# Patient Record
Sex: Female | Born: 1995 | Race: Black or African American | Hispanic: No | Marital: Single | Smoking: Never smoker
Health system: Southern US, Community
[De-identification: ages and names within clinical notes are randomized; demographics above are authoritative.]

---

## 2016-09-21 ENCOUNTER — Encounter (HOSPITAL_COMMUNITY): Payer: Self-pay

## 2016-09-21 ENCOUNTER — Emergency Department (HOSPITAL_COMMUNITY)
Admission: EM | Admit: 2016-09-21 | Discharge: 2016-09-22 | Disposition: A | Discharge status: Home / Self Care | Payer: Self-pay | Attending: Emergency Medicine | Admitting: Emergency Medicine

## 2016-09-21 ENCOUNTER — Emergency Department (HOSPITAL_COMMUNITY): Payer: Self-pay

## 2016-09-21 DIAGNOSIS — J069 Acute upper respiratory infection, unspecified: Secondary | ICD-10-CM | POA: Insufficient documentation

## 2016-09-21 DIAGNOSIS — R05 Cough: Secondary | ICD-10-CM | POA: Insufficient documentation

## 2016-09-21 NOTE — ED Provider Notes (Signed)
[facility]-EMERGENCY DEPT Provider Note   CSN: [id number] Arrival date & time: 09/21/16  2026  By signing my name below, I, Shannon Sloan, attest that this documentation has been prepared under the direction and in the presence of [employer] Eathon Valade, PA-C.  Electronically Signed: Rosario AdieWilliam Andrew Sloan, ED Scribe. 09/22/16. 12:06 AM.  History   Chief Complaint Chief Complaint  Patient presents with  . Cough   The history is provided by the patient. No language interpreter was used.    HPI Comments: Shannon Sloan is a 21 y.o. female with no pertinent PMHx, who presents to the Emergency Department complaining of persistent, gradually worsening, productive cough with yellow-green sputum beginning four days ago. She additionally notes that over the past two days that some of her sputum contained blood-tinged specks and that this problem has also been gradually worsening. She reports associated sore throat with the onset of her cough. Pt additionally notes that she has chest pain secondary to coughing only, but none otherwise. No noted treatments for her symptoms were tried prior to coming into the ED. No Hx of PE/DVT, recent long travel, surgery, fracture, prolonged immobilization, hormone use.  She denies shortness of breath, fever, claudication, leg swelling, sensation of palpitations, or any other associated symptoms.   History reviewed. No pertinent past medical history.  There are no active problems to display for this patient.   History reviewed. No pertinent surgical history.  OB History    No data available       Home Medications    Prior to Admission medications   Medication Sig Start Date End Date Taking? Authorizing Provider  azithromycin (ZITHROMAX Z-PAK) 250 MG tablet Take 2 tablets the first day and then 1 tablet daily for 4 more days 09/22/16   Shannon ReiningNicole Jessel Gettinger, PA-C  benzonatate (TESSALON) 100 MG capsule Take 1 capsule (100 mg total) by mouth every 8 (eight) hours.  09/22/16   Shannon ReiningNicole Sham Alviar, PA-C    Family History No family history on file.  Social History Social History  Substance Use Topics  . Smoking status: Never Smoker  . Smokeless tobacco: Never Used  . Alcohol use No     Allergies   Patient has no allergy information on record.   Review of Systems Review of Systems  A complete 10 system review of systems was obtained and all systems are negative except as noted in the HPI and PMH.   Physical Exam Updated Vital Signs BP 127/62 (BP Location: Right Arm)   Pulse 88   Temp 99.5 F (37.5 C) (Oral)   Resp 16   LMP 09/02/2016   SpO2 100%   Physical Exam  Constitutional: She is oriented to person, place, and time. She appears well-developed and well-nourished. No distress.  HENT:  Head: Normocephalic.  Right Ear: External ear normal.  Left Ear: External ear normal.  Mouth/Throat: Oropharynx is clear and moist. No oropharyngeal exudate.  No drooling or stridor. Posterior pharynx mildly erythematous no significant tonsillar hypertrophy. No exudate. Soft palate rises symmetrically. No TTP or induration under tongue.   No tenderness to palpation of frontal or bilateral maxillary sinuses.  Mild mucosal edema in the nares with scant rhinorrhea.  Bilateral tympanic membranes with normal architecture and good light reflex.    Eyes: Conjunctivae and EOM are normal. Pupils are equal, round, and reactive to light.  Neck: Normal range of motion. Neck supple. No JVD present. No tracheal deviation present.  Cardiovascular: Normal rate, regular rhythm and intact distal pulses.  Pulmonary/Chest: Effort normal and breath sounds normal. No stridor. No respiratory distress. She has no wheezes. She has no rales. She exhibits no tenderness.  Abdominal: Soft. She exhibits no distension and no mass. There is no tenderness. There is no rebound and no guarding.  Musculoskeletal: Normal range of motion. She exhibits no edema or tenderness.  No  calf asymmetry, superficial collaterals, palpable cords, edema, Homans sign negative bilaterally.    Neurological: She is alert and oriented to person, place, and time.  Skin: Skin is warm. She is not diaphoretic.  Psychiatric: She has a normal mood and affect.  Nursing note and vitals reviewed.    ED Treatments / Results  DIAGNOSTIC STUDIES: Oxygen Saturation is 100% on RA, normal by my interpretation.   COORDINATION OF CARE: 12:06 AM-Discussed next steps with pt. Pt verbalized understanding and is agreeable with the plan.   Labs (all labs ordered are listed, but only abnormal results are displayed) Labs Reviewed - No data to display  EKG  EKG Interpretation None       Radiology Dg Chest 2 View  Result Date: 09/21/2016 CLINICAL DATA:  Acute onset of productive cough. Blood-tinged mucus. Initial encounter. EXAM: CHEST  2 VIEW COMPARISON:  None. FINDINGS: The lungs are well-aerated and clear. There is no evidence of focal opacification, pleural effusion or pneumothorax. The heart is normal in size; the mediastinal contour is within normal limits. No acute osseous abnormalities are seen. IMPRESSION: No acute cardiopulmonary process seen. Electronically Signed   By: Roanna Raider M.D.   On: 09/21/2016 21:58    Procedures Procedures (including critical care time)  Medications Ordered in ED Medications - No data to display   Initial Impression / Assessment and Plan / ED Course  I have reviewed the triage vital signs and the nursing notes.  Pertinent labs & imaging results that were available during my care of the patient were reviewed by me and considered in my medical decision making (see chart for details).  Clinical Course     Vitals:   09/21/16 2110 09/21/16 2346  BP: 127/62   Pulse: 88   Resp: 16   Temp: 98.9 F (37.2 C) 99.5 F (37.5 C)  TempSrc: Oral Oral  SpO2: 100%      Shannon Sloan is 21 y.o. female presenting with Rhinorrhea, cough worsening over  the course of the week. She had some blood-streaked sputum. I doubt this is PE, patient has no risk factors, there is no tachypnea or tachycardia. This was likely a URI. Given the worsening symptoms will cover for bacterial infection with azithromycin, patient given Jerilynn Som and work note  Evaluation does not show pathology that would require ongoing emergent intervention or inpatient treatment. Pt is hemodynamically stable and mentating appropriately. Discussed findings and plan with patient/guardian, who agrees with care plan. All questions answered. Return precautions discussed and outpatient follow up given.      Final Clinical Impressions(s) / ED Diagnoses   Final diagnoses:  Upper respiratory tract infection, unspecified type    New Prescriptions New Prescriptions   AZITHROMYCIN (ZITHROMAX Z-PAK) 250 MG TABLET    Take 2 tablets the first day and then 1 tablet daily for 4 more days   BENZONATATE (TESSALON) 100 MG CAPSULE    Take 1 capsule (100 mg total) by mouth every 8 (eight) hours.   I personally performed the services described in this documentation, which was scribed in my presence. The recorded information has been reviewed and is accurate.  Wynetta Emery, PA-C 09/22/16 0017    Layla Maw Ward, DO 09/22/16 [id number]

## 2016-09-21 NOTE — ED Triage Notes (Signed)
Pt reports productive cough since Monday, today she noticed her mucus was blood tinged. Denies any pain but reports dry throat.

## 2016-09-21 NOTE — ED Notes (Signed)
Pt stated she has been running a fever, chills then hot, achy, congestion, headache and coughing.

## 2016-09-22 MED ORDER — AZITHROMYCIN 250 MG PO TABS: ORAL_TABLET | ORAL | 0 refills | Status: AC

## 2016-09-22 MED ORDER — BENZONATATE 100 MG PO CAPS: 100.0000 mg | ORAL_CAPSULE | Freq: Three times a day (TID) | ORAL | 0 refills | Status: AC

## 2016-09-22 NOTE — Discharge Instructions (Signed)
Return to the emergency room for any worsening or concerning symptoms including fast breathing, heart racing, confusion, vomiting.  Rest, cover your mouth when you cough and wash your hands frequently.   Push fluids: water or Gatorade, do not drink any soda, juice or caffeinated beverages.  For fever and pain control you can take Motrin (ibuprofen) as follows: 400 mg (this is normally 2 over the counter pills) every 4 hours with food.  Do not return to work until a day after your fever breaks.  Take your antibiotics as directed and to completion. You should never have any leftover antibiotics! Push fluids and stay well hydrated.   Any antibiotic use can reduce the efficacy of hormonal birth control. Please use back up method of contraception.

## 2018-01-12 IMAGING — DX DG CHEST 2V
2 series · 2 of 2 positions shown · non-contrast
Comparison: None.

CLINICAL DATA: Acute onset of productive cough. Blood-tinged mucus.
Initial encounter.

EXAM:
CHEST  2 VIEW

[chest pa]
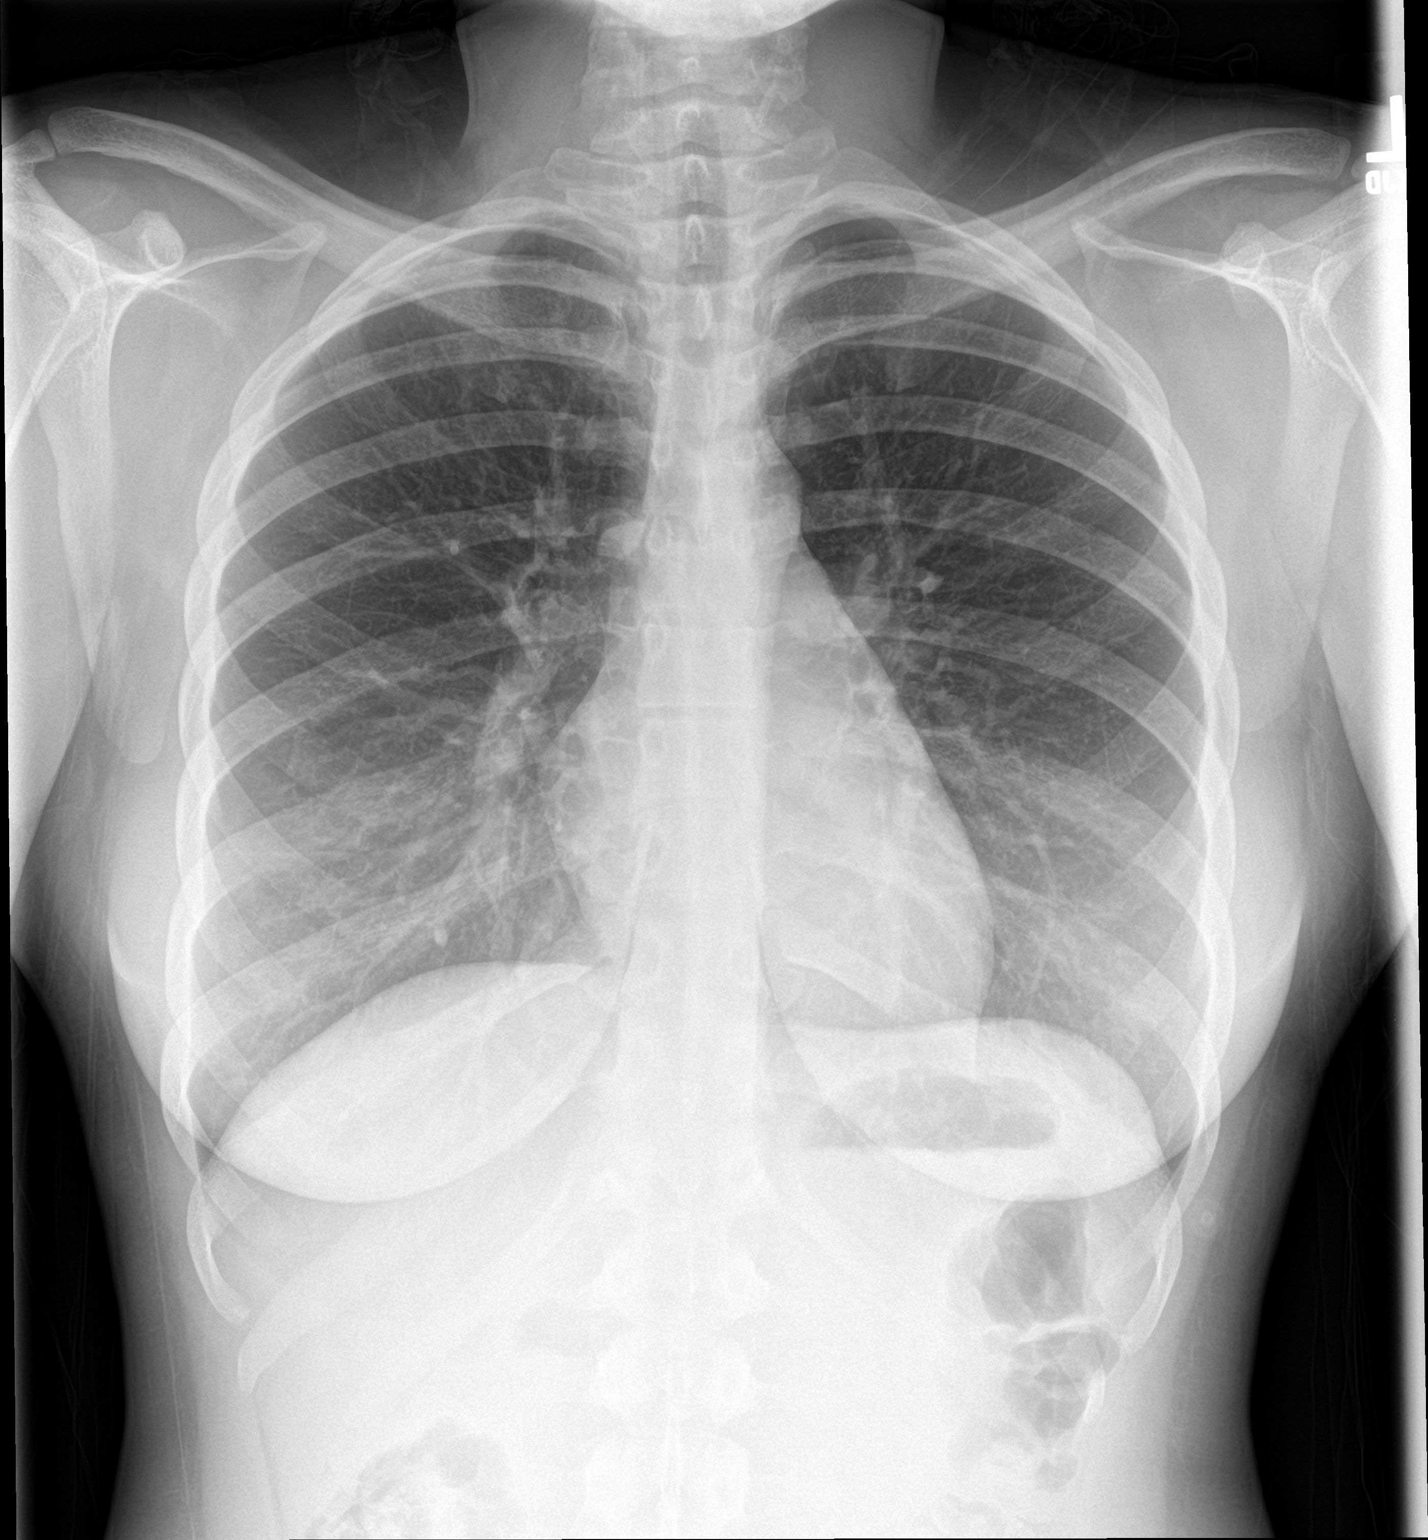

[chest lat]
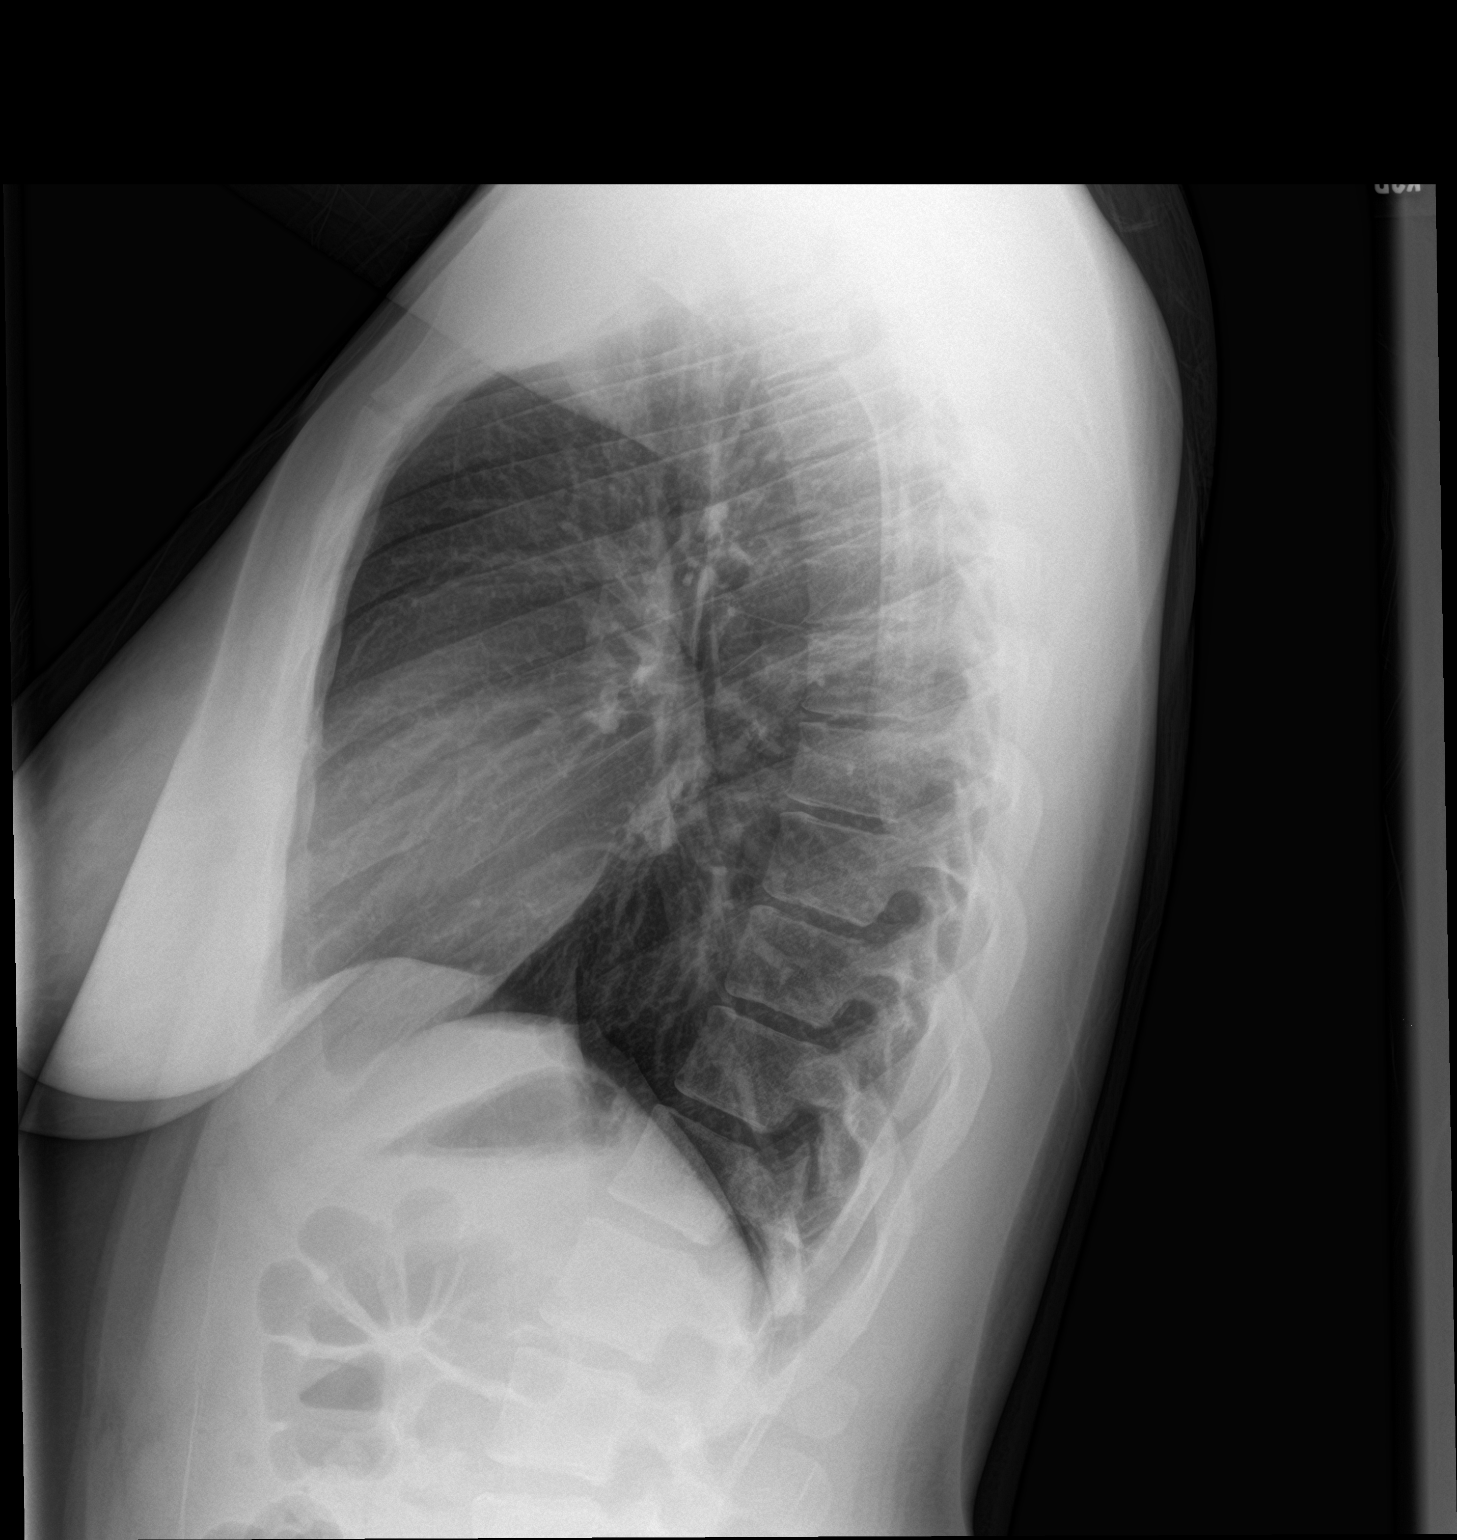

[2 of 2 positions shown; findings below may reference images not displayed]

FINDINGS: The lungs are well-aerated and clear. There is no evidence of focal
opacification, pleural effusion or pneumothorax.

The heart is normal in size; the mediastinal contour is within
normal limits. No acute osseous abnormalities are seen.
IMPRESSION: No acute cardiopulmonary process seen.
